# Patient Record
Sex: Male | Born: 1945 | Race: White | Hispanic: No | Smoking: Current every day smoker
Health system: Southern US, Community
[De-identification: ages and names within clinical notes are randomized; demographics above are authoritative.]

## PROBLEM LIST (undated history)

## (undated) DIAGNOSIS — Z7189 Other specified counseling: Secondary | ICD-10-CM

## (undated) DIAGNOSIS — E119 Type 2 diabetes mellitus without complications: Secondary | ICD-10-CM

## (undated) DIAGNOSIS — I2510 Atherosclerotic heart disease of native coronary artery without angina pectoris: Secondary | ICD-10-CM

## (undated) DIAGNOSIS — C787 Secondary malignant neoplasm of liver and intrahepatic bile duct: Secondary | ICD-10-CM

## (undated) DIAGNOSIS — I219 Acute myocardial infarction, unspecified: Secondary | ICD-10-CM

## (undated) DIAGNOSIS — C189 Malignant neoplasm of colon, unspecified: Secondary | ICD-10-CM

## (undated) DIAGNOSIS — C180 Malignant neoplasm of cecum: Secondary | ICD-10-CM

## (undated) DIAGNOSIS — I10 Essential (primary) hypertension: Secondary | ICD-10-CM

## (undated) HISTORY — PX: CORONARY ANGIOPLASTY WITH STENT PLACEMENT: SHX49

## (undated) HISTORY — PX: COLON SURGERY: SHX602

## (undated) HISTORY — DX: Malignant neoplasm of cecum: C18.0

## (undated) HISTORY — DX: Secondary malignant neoplasm of liver and intrahepatic bile duct: C78.7

## (undated) HISTORY — DX: Malignant neoplasm of colon, unspecified: C18.9

## (undated) HISTORY — DX: Other specified counseling: Z71.89

---

## 2017-03-22 ENCOUNTER — Emergency Department (HOSPITAL_BASED_OUTPATIENT_CLINIC_OR_DEPARTMENT_OTHER)
Admission: EM | Admit: 2017-03-22 | Discharge: 2017-03-22 | Disposition: A | Discharge status: Home / Self Care | Payer: Medicare HMO | Attending: Emergency Medicine | Admitting: Emergency Medicine

## 2017-03-22 ENCOUNTER — Emergency Department (HOSPITAL_BASED_OUTPATIENT_CLINIC_OR_DEPARTMENT_OTHER): Payer: Medicare HMO

## 2017-03-22 ENCOUNTER — Encounter (HOSPITAL_BASED_OUTPATIENT_CLINIC_OR_DEPARTMENT_OTHER): Payer: Self-pay | Admitting: *Deleted

## 2017-03-22 ENCOUNTER — Other Ambulatory Visit: Payer: Self-pay

## 2017-03-22 DIAGNOSIS — I2510 Atherosclerotic heart disease of native coronary artery without angina pectoris: Secondary | ICD-10-CM | POA: Diagnosis not present

## 2017-03-22 DIAGNOSIS — E119 Type 2 diabetes mellitus without complications: Secondary | ICD-10-CM | POA: Diagnosis not present

## 2017-03-22 DIAGNOSIS — Z7984 Long term (current) use of oral hypoglycemic drugs: Secondary | ICD-10-CM | POA: Diagnosis not present

## 2017-03-22 DIAGNOSIS — Z79899 Other long term (current) drug therapy: Secondary | ICD-10-CM | POA: Diagnosis not present

## 2017-03-22 DIAGNOSIS — F17210 Nicotine dependence, cigarettes, uncomplicated: Secondary | ICD-10-CM | POA: Diagnosis not present

## 2017-03-22 DIAGNOSIS — C189 Malignant neoplasm of colon, unspecified: Secondary | ICD-10-CM | POA: Insufficient documentation

## 2017-03-22 DIAGNOSIS — C772 Secondary and unspecified malignant neoplasm of intra-abdominal lymph nodes: Secondary | ICD-10-CM | POA: Diagnosis not present

## 2017-03-22 DIAGNOSIS — I10 Essential (primary) hypertension: Secondary | ICD-10-CM | POA: Diagnosis not present

## 2017-03-22 DIAGNOSIS — Z955 Presence of coronary angioplasty implant and graft: Secondary | ICD-10-CM | POA: Diagnosis not present

## 2017-03-22 DIAGNOSIS — R1084 Generalized abdominal pain: Secondary | ICD-10-CM | POA: Diagnosis present

## 2017-03-22 HISTORY — DX: Acute myocardial infarction, unspecified: I21.9

## 2017-03-22 HISTORY — DX: Atherosclerotic heart disease of native coronary artery without angina pectoris: I25.10

## 2017-03-22 HISTORY — DX: Type 2 diabetes mellitus without complications: E11.9

## 2017-03-22 HISTORY — DX: Essential (primary) hypertension: I10

## 2017-03-22 HISTORY — DX: Malignant neoplasm of colon, unspecified: C18.9

## 2017-03-22 LAB — CBC WITH DIFFERENTIAL/PLATELET
Basophils Absolute: 0 10*3/uL (ref 0.0–0.1)
Basophils Relative: 0 %
Eosinophils Absolute: 0.3 10*3/uL (ref 0.0–0.7)
Eosinophils Relative: 3 %
HCT: 43.6 % (ref 39.0–52.0)
Hemoglobin: 14.9 g/dL (ref 13.0–17.0)
Lymphocytes Relative: 18 %
Lymphs Abs: 1.7 10*3/uL (ref 0.7–4.0)
MCH: 31.4 pg (ref 26.0–34.0)
MCHC: 34.2 g/dL (ref 30.0–36.0)
MCV: 92 fL (ref 78.0–100.0)
Monocytes Absolute: 0.8 10*3/uL (ref 0.1–1.0)
Monocytes Relative: 9 %
Neutro Abs: 6.4 10*3/uL (ref 1.7–7.7)
Neutrophils Relative %: 70 %
Platelets: 220 10*3/uL (ref 150–400)
RBC: 4.74 MIL/uL (ref 4.22–5.81)
RDW: 14.4 % (ref 11.5–15.5)
WBC: 9.1 10*3/uL (ref 4.0–10.5)

## 2017-03-22 LAB — COMPREHENSIVE METABOLIC PANEL
ALT: 27 U/L (ref 17–63)
AST: 23 U/L (ref 15–41)
Albumin: 4.4 g/dL (ref 3.5–5.0)
Alkaline Phosphatase: 95 U/L (ref 38–126)
Anion gap: 7 (ref 5–15)
BUN: 14 mg/dL (ref 6–20)
CO2: 27 mmol/L (ref 22–32)
Calcium: 9.7 mg/dL (ref 8.9–10.3)
Chloride: 103 mmol/L (ref 101–111)
Creatinine, Ser: 1.02 mg/dL (ref 0.61–1.24)
GFR calc Af Amer: >60 mL/min (ref >60)
GFR calc non Af Amer: >60 mL/min (ref >60)
Glucose, Bld: 134 mg/dL — ABNORMAL HIGH (ref 65–99)
Potassium: 3.5 mmol/L (ref 3.5–5.1)
Sodium: 137 mmol/L (ref 135–145)
Total Bilirubin: 0.6 mg/dL (ref 0.3–1.2)
Total Protein: 7.9 g/dL (ref 6.5–8.1)

## 2017-03-22 LAB — I-STAT CG4 LACTIC ACID, ED: Lactic Acid, Venous: 1.13 mmol/L (ref 0.5–1.9)

## 2017-03-22 LAB — URINALYSIS, ROUTINE W REFLEX MICROSCOPIC
Bilirubin Urine: NEGATIVE
Glucose, UA: NEGATIVE mg/dL
Hgb urine dipstick: NEGATIVE
Ketones, ur: NEGATIVE mg/dL
Leukocytes, UA: NEGATIVE
Nitrite: NEGATIVE
Protein, ur: NEGATIVE mg/dL
Specific Gravity, Urine: 1.01 (ref 1.005–1.030)
pH: 6 (ref 5.0–8.0)

## 2017-03-22 LAB — LIPASE, BLOOD: Lipase: 31 U/L (ref 11–51)

## 2017-03-22 MED ORDER — SODIUM CHLORIDE 0.9 % IV BOLUS (SEPSIS)
1000.0000 mL | Freq: Once | INTRAVENOUS | Status: AC
  Administered 2017-03-22: 1000 mL via INTRAVENOUS

## 2017-03-22 MED ORDER — IOPAMIDOL (ISOVUE-300) INJECTION 61%
100.0000 mL | Freq: Once | INTRAVENOUS | Status: AC | PRN
  Administered 2017-03-22: 100 mL via INTRAVENOUS

## 2017-03-22 NOTE — ED Notes (Signed)
Unable to void at this time. Transported to CT

## 2017-03-22 NOTE — ED Triage Notes (Addendum)
Patient states he was diagnosed with stage 4 colon cancer in feb 2018.  States he elected to have a colon resection and no chemo treatments.  Patient is currently using OTC meds, Graviola, grape seed abstract, flax seed oil curcumin, colloidal silver, carnigora.  States for the last one month, he has had diarrhea, which is watery and foul smelling, up to five times per day.  States he also has numbness in there lower right abdomen and groin and enlarged right testicle.  Pain is across the entire lower abdomen.  Patient reports a weight loss of 15-20 pounds as well.

## 2017-03-22 NOTE — ED Notes (Signed)
CT will need to wait for bun/creat to result prior to imaging with IV contrast per [facility] radiology protocol. Pt >69 years of age and hx DM.

## 2017-03-22 NOTE — ED Provider Notes (Signed)
[place] EMERGENCY DEPARTMENT Provider Note   CSN: [id number] Arrival date & time: 03/22/17  3532     History   Chief Complaint Chief Complaint  Patient presents with  . Abdominal Pain    HPI Peter Mccoy is a 71 y.o. male.  HPI Patient presents to the emergency department with abdominal discomfort along with diarrhea over the last month the patient states he had colon cancer resection done the beginning of this year.  Patient is a 20 pound weight loss over the last month.  Patient states that he did not seek any treatment after his colon cancer resection.  States he did mention that there were some lymph node spread at that time.  The patient denies chest pain, shortness of breath, headache,blurred vision, neck pain, fever, cough, weakness, numbness, dizziness, anorexia, edema, nausea, vomiting, rash, back pain, dysuria, hematemesis, bloody stool, near syncope, or syncope. Past Medical History:  Diagnosis Date  . Colon cancer ([place])    Stage 4 Colon Cancer  . Coronary artery disease    4 stents  . Diabetes mellitus without complication ([place])   . Hypertension   . MI (myocardial infarction) ([place])     There are no active problems to display for this patient.   Past Surgical History:  Procedure Laterality Date  . COLON SURGERY     Colon resection  . CORONARY ANGIOPLASTY WITH STENT PLACEMENT         Home Medications    Prior to Admission medications   Medication Sig Start Date End Date Taking? Authorizing Provider  amLODipine (NORVASC) 2.5 MG tablet Take 2.5 mg daily by mouth.   Yes [provider]  carvedilol (COREG) 12.5 MG tablet Take 12.5 mg 2 (two) times daily with a meal by mouth.   Yes [provider]  Flaxseed, Linseed, (GNP FLAX SEED OIL PO) Take by mouth.   Yes [provider]  GRAPE SEED EXTRACT PO Take by mouth.   Yes [provider]  isosorbide dinitrate (ISORDIL) 30 MG tablet Take 30 mg 4 (four) times  daily by mouth.   Yes [provider]  metFORMIN (GLUCOPHAGE) 500 MG tablet Take 1,000 mg 2 (two) times daily with a meal by mouth.   Yes [provider]  OVER THE COUNTER MEDICATION    Yes [provider]  [place]    Yes [provider]  [place]    Yes [provider]    Family History No family history on file.  Social History Social History   Tobacco Use  . Smoking status: Current Every Day Smoker    Packs/day: 1.00    Years: 50.00    Pack years: 50.00    Types: Cigarettes  . Smokeless tobacco: Never Used  Substance Use Topics  . Alcohol use: No    Frequency: Never  . Drug use: No     Allergies   Codeine   Review of Systems Review of Systems  All other systems negative except as documented in the HPI. All pertinent positives and negatives as reviewed in the HPI. Physical Exam Updated Vital Signs BP (!) 194/101 (BP Location: Right Arm)   Pulse (!) 56   Temp 97.8 F (36.6 C) (Oral)   Resp 20   Ht 6\' 5"  (1.956 m)   Wt 99.8 kg (220 lb)   SpO2 99%   BMI 26.09 kg/m   Physical Exam  Constitutional: He is oriented to person, place, and  time. He appears well-developed and well-nourished. No distress.  HENT:  Head: Normocephalic and atraumatic.  Mouth/Throat: Oropharynx is clear and moist.  Eyes: Pupils are equal, round, and reactive to light.  Neck: Normal range of motion. Neck supple.  Cardiovascular: Normal rate, regular rhythm and normal heart sounds. Exam reveals no gallop and no friction rub.  No murmur heard. Pulmonary/Chest: Effort normal and breath sounds normal. No respiratory distress. He has no wheezes.  Abdominal: Soft. Bowel sounds are normal. He exhibits no distension. There is generalized tenderness. There is no rigidity and no guarding.  Genitourinary: Right testis shows swelling. Right testis shows no tenderness.  Neurological: He is alert and oriented to person,  place, and time. He exhibits normal muscle tone. Coordination normal.  Skin: Skin is warm and dry. Capillary refill takes less than 2 seconds. No rash noted. No erythema.  Psychiatric: He has a normal mood and affect. His behavior is normal.  Nursing note and vitals reviewed.    ED Treatments / Results  Labs (all labs ordered are listed, but only abnormal results are displayed) Labs Reviewed  COMPREHENSIVE METABOLIC PANEL - Abnormal; Notable for the following components:      Result Value   Glucose, Bld 134 (*)    All other components within normal limits  LIPASE, BLOOD  CBC WITH DIFFERENTIAL/PLATELET  URINALYSIS, ROUTINE W REFLEX MICROSCOPIC  I-STAT CG4 LACTIC ACID, ED    EKG  EKG Interpretation None       Radiology US Scrotum  Result Date: 03/22/2017 CLINICAL DATA:  Right testicular pain and swelling for the past 2-3 days. EXAM: SCROTAL ULTRASOUND DOPPLER ULTRASOUND OF THE TESTICLES TECHNIQUE: Complete ultrasound examination of the testicles, epididymis, and other scrotal structures was performed. Color and spectral Doppler ultrasound were also utilized to evaluate blood flow to the testicles. COMPARISON:  None. FINDINGS: Right testicle Measurements: 4.1 x 2.9 x 3.3 cm. No mass or microlithiasis visualized. Left testicle Measurements: 3.9 x 2.5 x 2.8 cm. No mass or microlithiasis visualized. Right epididymis:  Normal in size and appearance. Left epididymis:  Normal in size and appearance. Hydrocele:  Moderate right and small left hydroceles. Varicocele:  None visualized. Pulsed Doppler interrogation of both testes demonstrates normal low resistance arterial and venous waveforms bilaterally. IMPRESSION: 1. Moderate right and small left hydroceles. Otherwise normal sonographic appearance of the bilateral testes. Electronically Signed   By: Titus Dubin M.D.   On: 03/22/2017 16:20   Ct Abdomen Pelvis W Contrast  Result Date: 03/22/2017 CLINICAL DATA:  Right colectomy for  perforated cecal mass. Stage IV colon cancer. Status post surgery in February of 2018. EXAM: CT ABDOMEN AND PELVIS WITH CONTRAST TECHNIQUE: Multidetector CT imaging of the abdomen and pelvis was performed using the standard protocol following bolus administration of intravenous contrast. CONTRAST:  150mL ISOVUE-300 IOPAMIDOL (ISOVUE-300) INJECTION 61% COMPARISON:  According to epic, this patient had a study on 06/27/2016 on those images cannot be located at this time. FINDINGS: Lower chest:  Unremarkable. Hepatobiliary: 2.3 cm subtle hypoattenuating lesion in the dome of the liver, concerning for metastatic disease. Differential attenuation in the liver parenchyma may be from differential perfusion or geographic fatty deposition. There is no evidence for gallstones, gallbladder wall thickening, or pericholecystic fluid. No intrahepatic or extrahepatic biliary dilation. Pancreas: No focal mass lesion. No dilatation of the main duct. No intraparenchymal cyst. No peripancreatic edema. Spleen: No splenomegaly. No focal mass lesion. Adrenals/Urinary Tract: Thickening of the adrenal glands noted without discrete nodule or mass. Tiny hypoattenuating lesions  in the right kidney are likely cysts. No evidence for hydroureter. The urinary bladder appears normal for the degree of distention. Stomach/Bowel: Stomach is nondistended. No gastric wall thickening. No evidence of outlet obstruction. Duodenum is normally positioned as is the ligament of Treitz. No small bowel obstruction. Status post right hemicolectomy with ileocolic anastomosis in the right upper quadrant. Probable diverticular change sigmoid colon. Vascular/Lymphatic: There is abdominal aortic atherosclerosis with 3.3 cm diameter infrarenal aorta. 18 mm short axis lymph node identified in the hepatoduodenal ligament. Other borderline hepatoduodenal ligament lymph nodes are associated. No retroperitoneal lymphadenopathy. No pelvic sidewall lymphadenopathy. There is  prominent nodularity in the mesentery with nodular soft tissue identified in the omentum and in the right upper abdomen. Upper normal lymph nodes are seen in the central small bowel mesenteric. There is peritoneal nodularity in the right para colic gutter with a trace amount of associated free fluid. 2.1 x 2.2 cm lesion in the central pelvis, adjacent to the rectosigmoid junction is concerning for metastatic disease. Reproductive: The prostate gland and seminal vesicles have normal imaging features. Other: Small volume intraperitoneal free fluid. Musculoskeletal: 17 mm soft tissue nodule identified in the midline anterior abdominal wall just cranial to the umbilicus. Just deep to the umbilicus is a 2.9 x 2.4 cm rim enhancing soft tissue nodule. 1 bilateral groin hernias contain only fat. Bone windows reveal no worrisome lytic or sclerotic osseous lesions. IMPRESSION: 1. Status post right hemicolectomy for colon cancer. No evidence for bowel obstruction. 2. Metastatic disease identified in the dome of the liver with diffuse nodularity of the mesentery, omentum, and peritoneal lining consistent with metastatic disease. There is borderline to mild lymphadenopathy in the hepatoduodenal ligament and central mesentery. At least 2 discrete soft tissue nodules are identified in the midline anterior abdominal wall concerning for metastatic disease and a 2.1 cm lesion in the central pelvis is also concerning for metastatic deposit. Electronically Signed   By: Misty Stanley M.D.   On: 03/22/2017 12:31   US Pelvic Doppler (torsion R/o Or Mass Arterial Flow)  Result Date: 03/22/2017 CLINICAL DATA:  Right testicular pain and swelling for the past 2-3 days. EXAM: SCROTAL ULTRASOUND DOPPLER ULTRASOUND OF THE TESTICLES TECHNIQUE: Complete ultrasound examination of the testicles, epididymis, and other scrotal structures was performed. Color and spectral Doppler ultrasound were also utilized to evaluate blood flow to the  testicles. COMPARISON:  None. FINDINGS: Right testicle Measurements: 4.1 x 2.9 x 3.3 cm. No mass or microlithiasis visualized. Left testicle Measurements: 3.9 x 2.5 x 2.8 cm. No mass or microlithiasis visualized. Right epididymis:  Normal in size and appearance. Left epididymis:  Normal in size and appearance. Hydrocele:  Moderate right and small left hydroceles. Varicocele:  None visualized. Pulsed Doppler interrogation of both testes demonstrates normal low resistance arterial and venous waveforms bilaterally. IMPRESSION: 1. Moderate right and small left hydroceles. Otherwise normal sonographic appearance of the bilateral testes. Electronically Signed   By: Titus Dubin M.D.   On: 03/22/2017 16:20    Procedures Procedures (including critical care time)  Medications Ordered in ED Medications  sodium chloride 0.9 % bolus 1,000 mL (0 mLs Intravenous Stopped 03/22/17 1133)  iopamidol (ISOVUE-300) 61 % injection 100 mL (100 mLs Intravenous Contrast Given 03/22/17 1142)     Initial Impression / Assessment and Plan / ED Course  I have reviewed the triage vital signs and the nursing notes.  Pertinent labs & imaging results that were available during my care of the patient were reviewed by me  and considered in my medical decision making (see chart for details).    Advised the patient that there is metastatic disease noted on the CT scan that he would need to follow-up with oncology to formulate a plan otherwise he could still seek no treatment.  Advised patient return here as needed patient agrees to the plan and all questions were answered.  I did advise him to follow-up with his primary care doctor.  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

## 2017-03-22 NOTE — Discharge Instructions (Signed)
Follow-up with the specialist provided.  Your ultrasound did not show any significant abnormalities in the scrotum.  There was some mild fluid collection in the testicle. return here as needed.  follow-up with your primary care doctor.

## 2017-03-23 ENCOUNTER — Other Ambulatory Visit: Payer: Self-pay | Admitting: Oncology

## 2017-04-02 ENCOUNTER — Encounter: Payer: Self-pay | Admitting: Family

## 2017-04-02 ENCOUNTER — Other Ambulatory Visit: Payer: Self-pay

## 2017-04-02 ENCOUNTER — Ambulatory Visit (HOSPITAL_BASED_OUTPATIENT_CLINIC_OR_DEPARTMENT_OTHER): Payer: Medicare HMO | Admitting: Family

## 2017-04-02 ENCOUNTER — Other Ambulatory Visit (HOSPITAL_BASED_OUTPATIENT_CLINIC_OR_DEPARTMENT_OTHER): Payer: Medicare HMO

## 2017-04-02 ENCOUNTER — Other Ambulatory Visit: Payer: Self-pay | Admitting: Family

## 2017-04-02 VITALS — BP 98/62 | HR 60 | Temp 97.6°F | Resp 18 | Wt 220.0 lb

## 2017-04-02 DIAGNOSIS — C787 Secondary malignant neoplasm of liver and intrahepatic bile duct: Principal | ICD-10-CM

## 2017-04-02 DIAGNOSIS — C786 Secondary malignant neoplasm of retroperitoneum and peritoneum: Secondary | ICD-10-CM | POA: Diagnosis not present

## 2017-04-02 DIAGNOSIS — C779 Secondary and unspecified malignant neoplasm of lymph node, unspecified: Secondary | ICD-10-CM | POA: Diagnosis not present

## 2017-04-02 DIAGNOSIS — C189 Malignant neoplasm of colon, unspecified: Secondary | ICD-10-CM

## 2017-04-02 DIAGNOSIS — R197 Diarrhea, unspecified: Secondary | ICD-10-CM

## 2017-04-02 DIAGNOSIS — C180 Malignant neoplasm of cecum: Secondary | ICD-10-CM

## 2017-04-02 DIAGNOSIS — Z7189 Other specified counseling: Secondary | ICD-10-CM

## 2017-04-02 DIAGNOSIS — Z720 Tobacco use: Secondary | ICD-10-CM | POA: Diagnosis not present

## 2017-04-02 DIAGNOSIS — E119 Type 2 diabetes mellitus without complications: Secondary | ICD-10-CM | POA: Diagnosis not present

## 2017-04-02 DIAGNOSIS — R229 Localized swelling, mass and lump, unspecified: Secondary | ICD-10-CM

## 2017-04-02 LAB — CMP (CANCER CENTER ONLY)
ALBUMIN: 3.8 g/dL (ref 3.3–5.5)
ALT: 24 U/L (ref 10–47)
AST: 18 U/L (ref 11–38)
Alkaline Phosphatase: 96 U/L — ABNORMAL HIGH (ref 26–84)
BILIRUBIN TOTAL: 0.9 mg/dL (ref 0.20–1.60)
BUN, Bld: 19 mg/dL (ref 7–22)
CO2: 28 meq/L (ref 18–33)
CREATININE: 1.5 mg/dL — AB (ref 0.6–1.2)
Calcium: 9.3 mg/dL (ref 8.0–10.3)
Chloride: 100 mEq/L (ref 98–108)
GLUCOSE: 138 mg/dL — AB (ref 73–118)
Potassium: 3.7 mEq/L (ref 3.3–4.7)
SODIUM: 142 meq/L (ref 128–145)
Total Protein: 7.2 g/dL (ref 6.4–8.1)

## 2017-04-02 LAB — CBC WITH DIFFERENTIAL (CANCER CENTER ONLY)
BASO#: 0 10*3/uL (ref 0.0–0.2)
BASO%: 0.3 % (ref 0.0–2.0)
EOS%: 2.8 % (ref 0.0–7.0)
Eosinophils Absolute: 0.3 10*3/uL (ref 0.0–0.5)
HCT: 46 % (ref 38.7–49.9)
HEMOGLOBIN: 15.7 g/dL (ref 13.0–17.1)
LYMPH#: 2.1 10*3/uL (ref 0.9–3.3)
LYMPH%: 19.7 % (ref 14.0–48.0)
MCH: 31.8 pg (ref 28.0–33.4)
MCHC: 34.1 g/dL (ref 32.0–35.9)
MCV: 93 fL (ref 82–98)
MONO#: 0.9 10*3/uL (ref 0.1–0.9)
MONO%: 8.2 % (ref 0.0–13.0)
NEUT%: 69 % (ref 40.0–80.0)
NEUTROS ABS: 7.3 10*3/uL — AB (ref 1.5–6.5)
Platelets: 173 10*3/uL (ref 145–400)
RBC: 4.94 10*6/uL (ref 4.20–5.70)
RDW: 14.7 % (ref 11.1–15.7)
WBC: 10.5 10*3/uL — AB (ref 4.0–10.0)

## 2017-04-02 NOTE — Progress Notes (Addendum)
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 He was diagnosed with stage V colon cancer with partial colectomy with anastomosis in February 2018. 4/15 lymph node were positive and there was presence of a peritoneal implant. CEA at that time was 30.7.  He decided after speaking with his oncologist at [facility] that he did not want chemotherapy at that time.  He recently went to the ED with abdominal pain and CT scan of the abdomen and pelvis confirmed progression of disease into the liver, mesentery, omentum and peritoneal lining as well as 2 discrete soft tissue nodules in the midline anterior abdominal wall.  He is symptomatic with fatigue, diarrhea and abdominal pain at his time.  CEA today is 56.63. His CBC and CMP today are stable. No anemia, WBC count is 10.5.  No family history of cancer that he is aware of.  He had not eaten today and got dizzy after having his labs drawn. He felt better after having a coke and some crackers.  No fever, chills, n/v, cough, rash, SOB, chest pain, palpitations, abdominal pain or changes in bladder habits.  No swelling tingling in his extremities. He states that he is numb in his right thigh.  He has maintained a good appetite but has not wanted to eat for fear of having diarrhea. He is doing his best to hydrated. He states that his weight has dropped 20 lbs over the last month.  He is diabetic but states that his blood sugars are well controlled staying in the 120-130's.  He is a smoker, < 1 ppd. No ETOH.  He lives with his ex wife.   ROS: All other 10 point review of systems is negative.   PAST MEDICAL  HISTORY:   Past Medical History:  Diagnosis Date  . Colon cancer ([place])    Stage 4 Colon Cancer  . Coronary artery disease    4 stents  . Diabetes mellitus without complication ([place])   . Hypertension   . MI (myocardial infarction) ([place])     ALLERGIES: Allergies  Allergen Reactions  . Codeine Rash and Anxiety      MEDICATIONS:  Current Outpatient Medications on File Prior to Visit  Medication Sig Dispense Refill  . TURMERIC CURCUMIN PO Take by mouth.    Marland Kitchen amLODipine (NORVASC) 2.5 MG tablet Take 2.5 mg daily by mouth.    . carvedilol (COREG) 12.5 MG tablet Take 12.5 mg 2 (two) times daily with a meal by mouth.    Marland Kitchen GRAPE SEED EXTRACT PO Take by mouth.    . isosorbide dinitrate (ISORDIL) 30 MG tablet Take 30 mg by mouth daily.     Marland Kitchen lisinopril (PRINIVIL,ZESTRIL) 5 MG tablet     . metFORMIN (GLUCOPHAGE) 500 MG tablet Take 1,000 mg 2 (two) times daily with a meal by mouth.    Marland Kitchen OVER THE COUNTER MEDICATION     . OVER THE COUNTER MEDICATION     . OVER THE COUNTER MEDICATION      No current facility-administered medications on file prior to visit.      PAST SURGICAL HISTORY Past Surgical History:  Procedure Laterality Date  . COLON SURGERY     Colon resection  . CORONARY ANGIOPLASTY WITH STENT PLACEMENT      FAMILY HISTORY: History reviewed. No pertinent family history.  SOCIAL HISTORY:  reports that he has been smoking cigarettes.  He has a 50.00 pack-year smoking history. he has never used smokeless tobacco. He reports that he does not drink alcohol or use drugs.  PERFORMANCE STATUS: The patient's performance status is 1 - Symptomatic but completely ambulatory  PHYSICAL EXAM: Most Recent Vital Signs: Blood pressure 98/62, pulse 60, temperature 97.6 F (36.4 C), temperature source Oral, resp. rate 18, weight 220 lb (99.8 kg), SpO2 100 %. BP 98/62 (BP Location: Right Arm, Patient Position: Sitting)   Pulse 60   Temp 97.6 F (36.4 C) (Oral)   Resp 18   Wt 220 lb  (99.8 kg)   SpO2 100%   BMI 26.09 kg/m   General Appearance:    Alert, cooperative, no distress, appears stated age  Head:    Normocephalic, without obvious abnormality, atraumatic  Eyes:    PERRL, conjunctiva/corneas clear, EOM's intact, fundi    benign, both eyes             Throat:   Lips, mucosa, and tongue normal; teeth and gums normal  Neck:   Supple, symmetrical, trachea midline, no adenopathy;       thyroid:  No enlargement/tenderness/nodules; no carotid   bruit or JVD  Back:     Symmetric, no curvature, ROM normal, no CVA tenderness  Lungs:     Clear to auscultation bilaterally, respirations unlabored  Chest wall:    No tenderness or deformity  Heart:    Regular rate and rhythm, S1 and S2 normal, no murmur, rub   or gallop  Abdomen:     Soft, non-tender, bowel sounds active all four quadrants,    no masses, no organomegaly        Extremities:   Extremities normal, atraumatic, no cyanosis or edema  Pulses:   2+ and symmetric all extremities  Skin:   Skin color, texture, turgor normal, no rashes or lesions  Lymph nodes:   Cervical, supraclavicular, and axillary nodes normal  Neurologic:   CNII-XII intact. Normal strength, sensation and reflexes      throughout    LABORATORY DATA:  Results for orders placed or performed in visit on 04/02/17 (from the past 48 hour(s))  CBC w/Diff     Status: Abnormal   Collection Time: 04/02/17  3:12 PM  Result Value Ref Range   WBC 10.5 (H) 4.0 - 10.0 10e3/uL   RBC 4.94 4.20 - 5.70 10e6/uL   HGB 15.7 13.0 - 17.1 g/dL   HCT 46.0 38.7 - 49.9 %   MCV 93 82 - 98 fL   MCH 31.8 28.0 - 33.4 pg   MCHC 34.1 32.0 - 35.9 g/dL   RDW 14.7 11.1 - 15.7 %   Platelets 173 145 - 400 10e3/uL   NEUT# 7.3 (H) 1.5 - 6.5 10e3/uL   LYMPH# 2.1 0.9 - 3.3 10e3/uL   MONO# 0.9 0.1 - 0.9 10e3/uL   Eosinophils Absolute 0.3 0.0 - 0.5 10e3/uL   BASO# 0.0 0.0 - 0.2 10e3/uL   NEUT% 69.0 40.0 - 80.0 %   LYMPH% 19.7 14.0 - 48.0 %   MONO% 8.2 0.0 - 13.0 %   EOS%  2.8 0.0 - 7.0 %   BASO% 0.3 0.0 - 2.0 %  CMP STAT     Status: Abnormal  Collection Time: 04/02/17  3:12 PM  Result Value Ref Range   Sodium 142 128 - 145 mEq/L   Potassium 3.7 3.3 - 4.7 mEq/L   Chloride 100 98 - 108 mEq/L   CO2 28 18 - 33 mEq/L   Glucose, Bld 138 (H) 73 - 118 mg/dL   BUN, Bld 19 7 - 22 mg/dL   Creat 1.5 (H) 0.6 - 1.2 mg/dl   Total Bilirubin 0.90 0.20 - 1.60 mg/dl   Alkaline Phosphatase 96 (H) 26 - 84 U/L   AST 18 11 - 38 U/L   ALT(SGPT) 24 10 - 47 U/L   Total Protein 7.2 6.4 - 8.1 g/dL   Albumin 3.8 3.3 - 5.5 g/dL   Calcium 9.3 8.0 - 10.3 mg/dL      RADIOGRAPHY: No results found.     PATHOLOGY: None   ASSESSMENT/PLAN: Ms. Oldaker is a very pleasant 71 yo caucasian gentleman with metastatic colon cancer. He had a partial colectomy in February after diagnosis and then decided he did not want further treatment at that time. He recently went to the ED with abdominal pain and CT confirmed further progression of disease.  He is symptomatic with diarrhea and will try imodium.  We will get a chest xray this week.  We discussed Xeloda and Oxaliplatin in detail and went over possible side effects. He verbalized understanding and wants to start treatment.  We will try to get all of his surgical pathology and cytology reports from [facility].  We will plan to start treatment within the next 2 weeks.   All questions were answered and he is in agreement with the plan. He will contact our office with any questions or concerns. We can certainly see him sooner if necessary.  He was discussed with and also seen by Dr. Marin Olp and he is in agreement with the aforementioned.   [facility]    ADDENDUM:  I saw and examined the patient with Sarah.  I agree with the above assessment.  It is apparent that he would benefit from systemic therapy intervention.  It seems like when he was seen elsewhere, he really was not given options for treatment.  He felt like he was "just in  number."  He really did not feel like he was being heard.  I think that we can treat him with out having a Port-A-Cath placed.  He is just very nervous about having a Port-A-Cath put in.  I think that we can treat him with Xeloda and oxaliplatin.  It sounds like he has a right-sided colon tumor.  We really need to see what his genetic markers are.  His tumor is MSI stable.  We really need to see what his K-ras study is.  I believe that with the use of Xeloda/oxaliplatin, we can at least get a response.  He knows that he is dealing with a tumor that is not curable.  He clearly has metastatic disease.  It is been 8 months since he has had surgery.  We talked him for about an hour.  We gave him information about Xeloda and oxaliplatin.  He would be very interested in trying treatment.  Given that this is a right sided tumor, I will see if we need to add Avastin.  We will see what his CEA level is.  I do not think we have to get a PET scan on him.  I think the scans that he has had already are adequate.  Our goal here  is to definitely improve his quality of life.  Again, we are not going to cure this.  I think a chest x-ray would also be helpful.  We answered all of his questions.  He is appreciative that we are trying to be helpful but more importantly that we are actually working with him to honor his request.  We will try to get everything set up to start in a couple weeks.  Lattie Haw, MD

## 2017-04-03 ENCOUNTER — Encounter: Payer: Self-pay | Admitting: Family

## 2017-04-03 DIAGNOSIS — C189 Malignant neoplasm of colon, unspecified: Secondary | ICD-10-CM

## 2017-04-03 DIAGNOSIS — C787 Secondary malignant neoplasm of liver and intrahepatic bile duct: Secondary | ICD-10-CM

## 2017-04-03 DIAGNOSIS — Z7189 Other specified counseling: Secondary | ICD-10-CM

## 2017-04-03 DIAGNOSIS — C180 Malignant neoplasm of cecum: Secondary | ICD-10-CM

## 2017-04-03 HISTORY — DX: Secondary malignant neoplasm of liver and intrahepatic bile duct: C78.7

## 2017-04-03 HISTORY — DX: Other specified counseling: Z71.89

## 2017-04-03 HISTORY — DX: Malignant neoplasm of cecum: C18.0

## 2017-04-03 HISTORY — DX: Malignant neoplasm of colon, unspecified: C18.9

## 2017-04-03 LAB — CEA (IN HOUSE-CHCC): CEA (CHCC-IN HOUSE): 56.63 ng/mL — AB (ref 0.00–5.00)

## 2017-04-03 LAB — LACTATE DEHYDROGENASE: LDH: 201 U/L (ref 125–245)

## 2017-04-03 NOTE — Progress Notes (Signed)
START ON PATHWAY REGIMEN - Colorectal     A cycle is every 21 days:     Capecitabine      Oxaliplatin      Bevacizumab   **Always confirm dose/schedule in your pharmacy ordering system**    Patient Characteristics: Metastatic Colorectal, First Line, Nonsurgical Candidate, KRAS Mutation Positive/Unknown, BRAF Wild-Type/Unknown, PS = 0,1; Bevacizumab Eligible Current evidence of distant metastases<= Yes AJCC T Category: T3 AJCC N Category: N2 AJCC M Category: M1c AJCC 8 Stage Grouping: IVC BRAF Mutation Status: Awaiting Test Results KRAS/NRAS Mutation Status: Awaiting Test Results Line of therapy: First Line Would you be surprised if this patient died  in the next year<= I would be surprised if this patient died in the next year Performance Status: PS = 0, 1 Bevacizumab Eligibility: Eligible Intent of Therapy: Non-Curative / Palliative Intent, Discussed with Patient

## 2017-04-09 ENCOUNTER — Other Ambulatory Visit: Payer: Medicare HMO

## 2017-04-14 ENCOUNTER — Ambulatory Visit: Payer: Medicare HMO | Admitting: Hematology & Oncology

## 2017-04-14 ENCOUNTER — Ambulatory Visit: Payer: Medicare HMO

## 2017-04-14 ENCOUNTER — Other Ambulatory Visit: Payer: Medicare HMO

## 2017-04-15 ENCOUNTER — Ambulatory Visit: Payer: Medicare HMO

## 2017-04-15 ENCOUNTER — Ambulatory Visit: Payer: Medicare HMO | Admitting: Hematology & Oncology

## 2017-04-15 ENCOUNTER — Other Ambulatory Visit: Payer: Medicare HMO

## 2017-05-06 ENCOUNTER — Ambulatory Visit: Payer: Medicare HMO

## 2017-05-06 ENCOUNTER — Ambulatory Visit: Payer: Medicare HMO | Admitting: Hematology & Oncology

## 2017-05-06 ENCOUNTER — Other Ambulatory Visit: Payer: Medicare HMO

## 2017-05-07 ENCOUNTER — Encounter: Payer: Self-pay | Admitting: Hematology & Oncology

## 2017-05-27 ENCOUNTER — Inpatient Hospital Stay: Payer: Medicare HMO

## 2017-05-27 ENCOUNTER — Inpatient Hospital Stay: Payer: Medicare HMO | Attending: Hematology & Oncology | Admitting: Hematology & Oncology

## 2017-06-05 DEATH — deceased

## 2017-06-17 ENCOUNTER — Ambulatory Visit: Payer: Medicare HMO

## 2017-06-17 ENCOUNTER — Ambulatory Visit: Payer: Medicare HMO | Admitting: Hematology & Oncology

## 2017-06-17 ENCOUNTER — Inpatient Hospital Stay: Payer: Self-pay

## 2018-11-01 IMAGING — CT CT ABD-PELV W/ CM
2 of 5 series · 15 of 46 positions shown, 17 images · IV contrast (APPLIED)
Comparison: According to [REDACTED], this patient had a study on
06/27/2016 on those images cannot be located at this time.

CLINICAL DATA: Right colectomy for perforated cecal mass. Stage IV
colon cancer. Status post surgery in Sunday June, 2016.

EXAM:
CT ABDOMEN AND PELVIS WITH CONTRAST
TECHNIQUE: Multidetector CT imaging of the abdomen and pelvis was performed
using the standard protocol following bolus administration of
intravenous contrast.
CONTRAST:  100mL [id number]-500 IOPAMIDOL ([id number]-500) INJECTION 61%

[Series 2: axial st · axial · 0.85mm/px · z∈[-482,-42]mm · 12 of 100 slices shown, 14 images]
[im 6/100  soft-tissue]
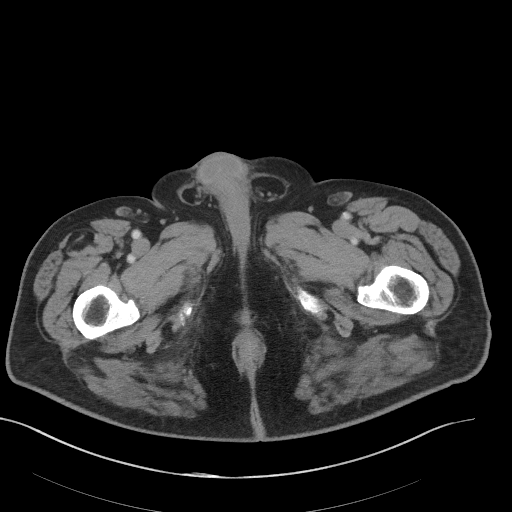
[im 6/100  bone]
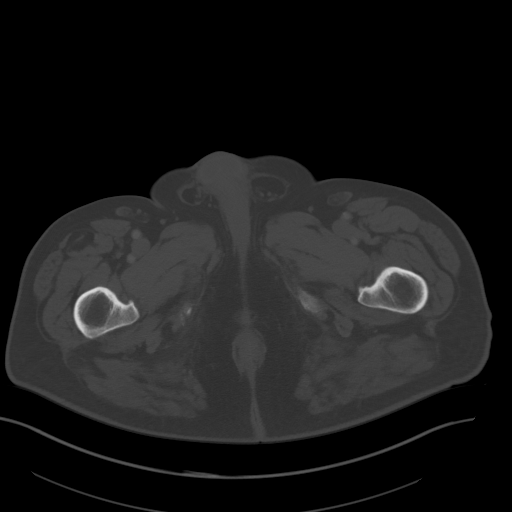
[im 16/100  soft-tissue]
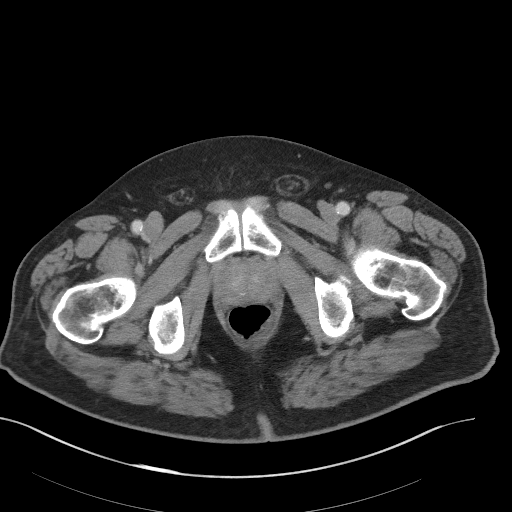
[im 21/100  soft-tissue]
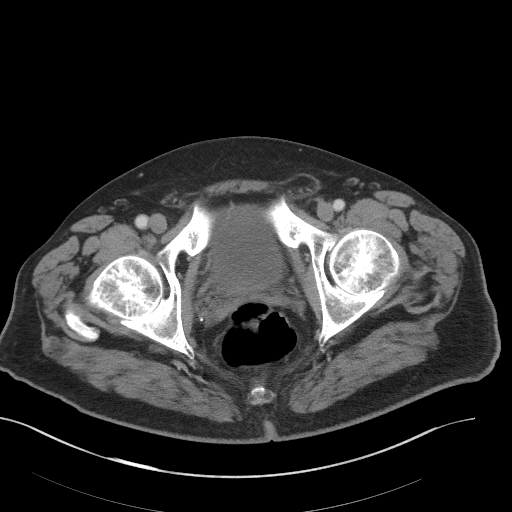
[im 32/100  soft-tissue]
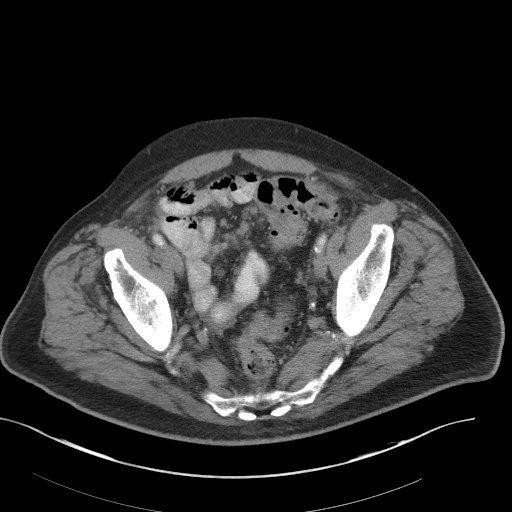
[im 37/100  soft-tissue]
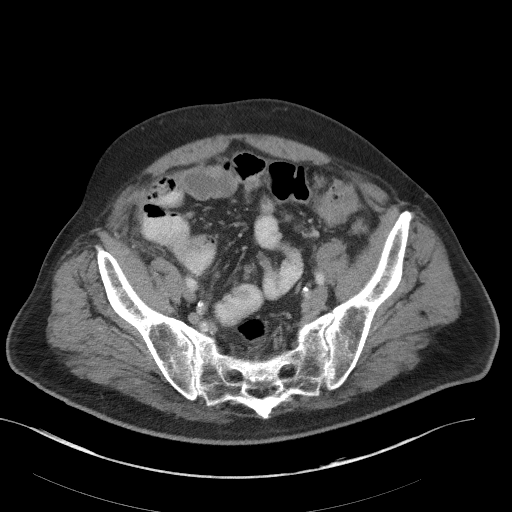
[im 47/100  soft-tissue]
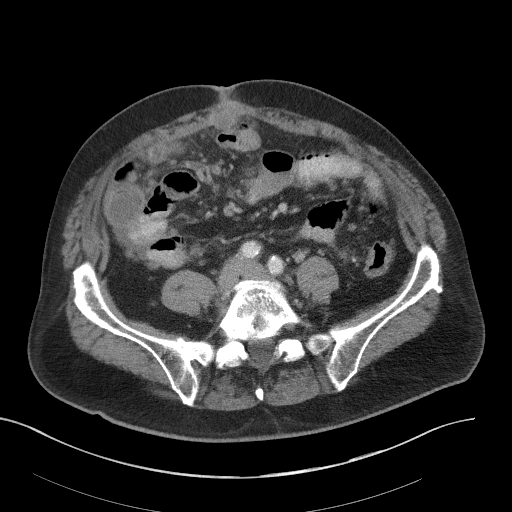
[im 53/100  soft-tissue]
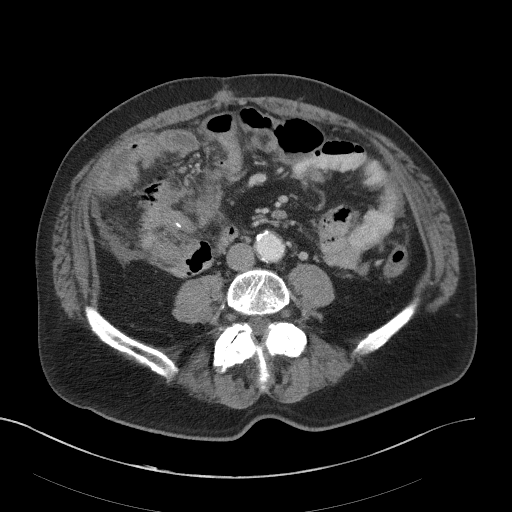
[im 63/100  soft-tissue]
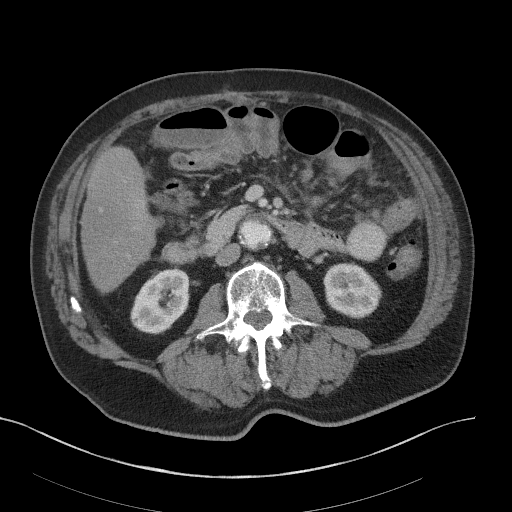
[im 68/100  soft-tissue]
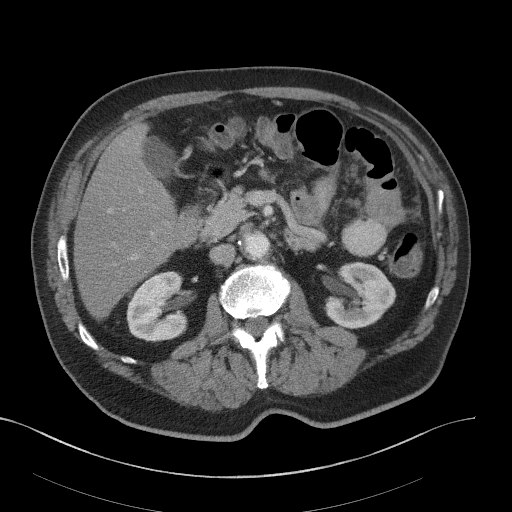
[im 68/100  bone]
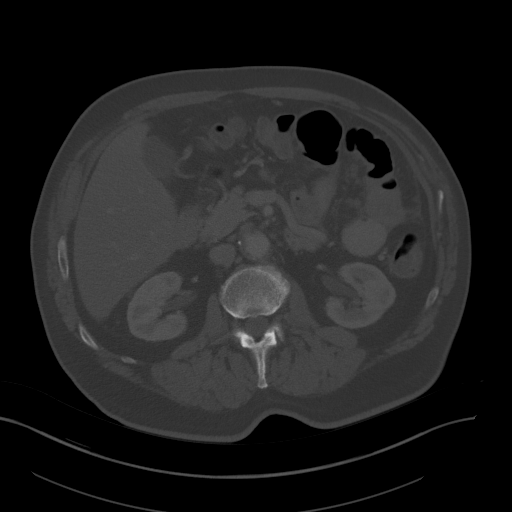
[im 79/100  soft-tissue]
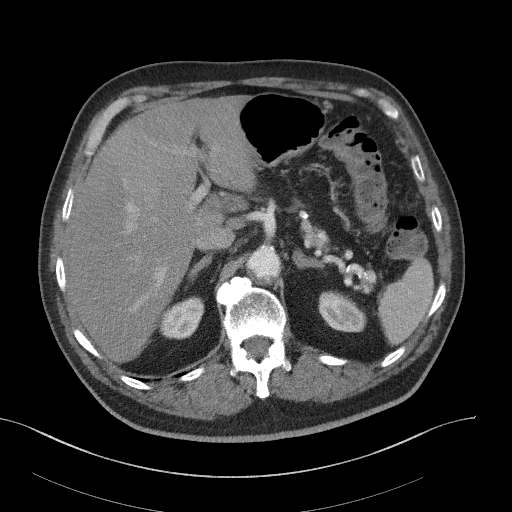
[im 84/100  soft-tissue]
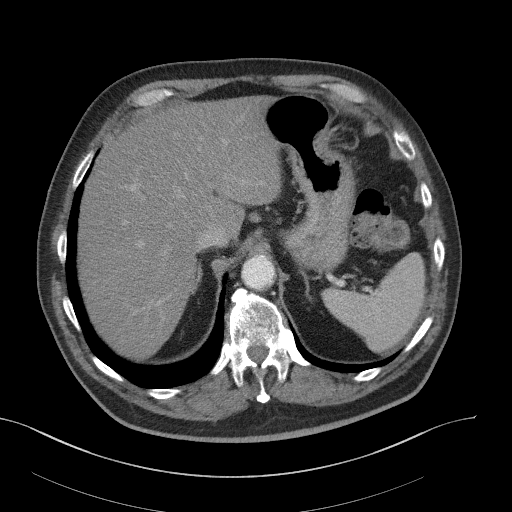
[im 94/100  soft-tissue]
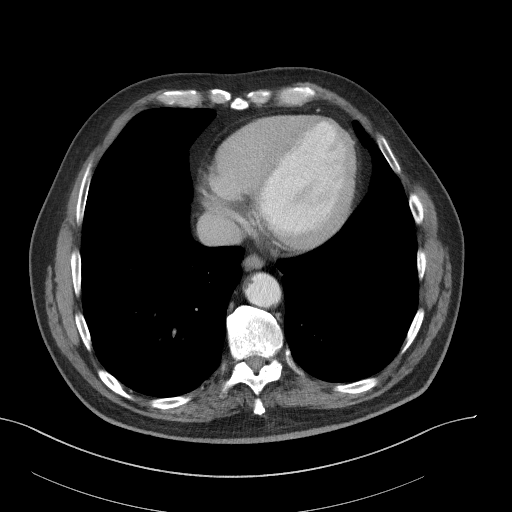

[Series 5: coronal st · coronal · 0.86mm/px · 3 of 101 slices shown]
[im 34/101  soft-tissue]
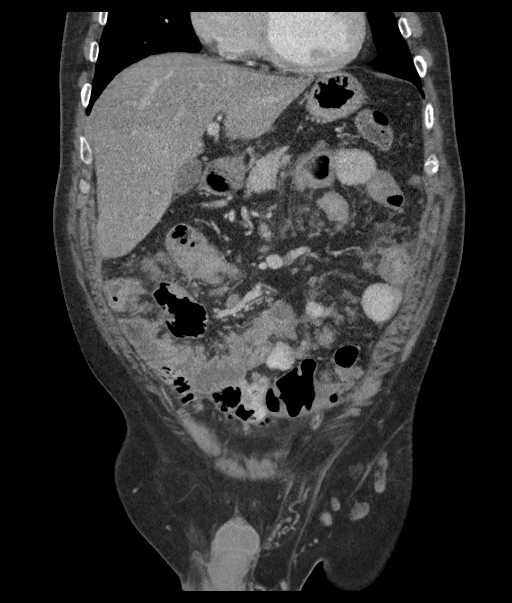
[im 45/101  soft-tissue]
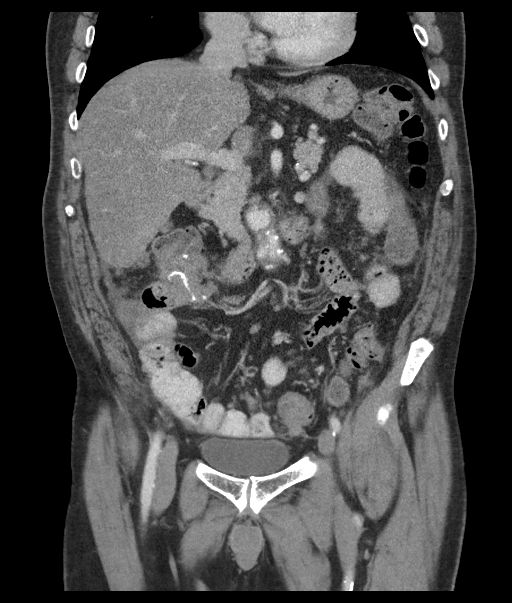
[im 56/101  soft-tissue]
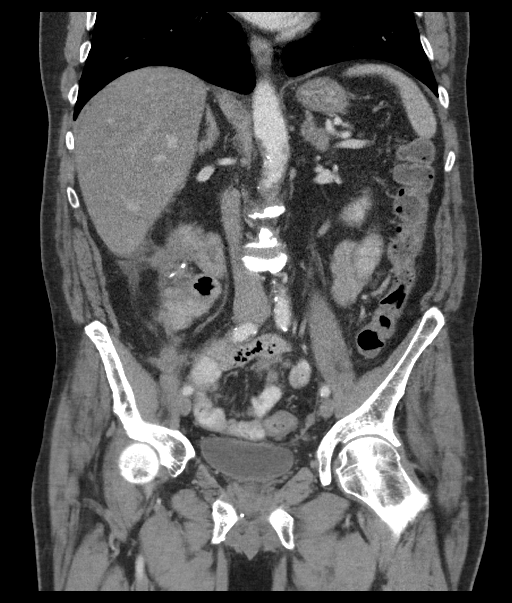

[15 of 46 positions shown; findings below may reference images not displayed]

FINDINGS: Lower chest:  Unremarkable.

Hepatobiliary: 2.3 cm subtle hypoattenuating lesion in the dome of
the liver, concerning for metastatic disease. Differential
attenuation in the liver parenchyma may be from differential
perfusion or geographic fatty deposition. There is no evidence for
gallstones, gallbladder wall thickening, or pericholecystic fluid.
No intrahepatic or extrahepatic biliary dilation.

Pancreas: No focal mass lesion. No dilatation of the main duct. No
intraparenchymal cyst. No peripancreatic edema.

Spleen: No splenomegaly. No focal mass lesion.

Adrenals/Urinary Tract: Thickening of the adrenal glands noted
without discrete nodule or mass. Tiny hypoattenuating lesions in the
right kidney are likely cysts. No evidence for hydroureter. The
urinary bladder appears normal for the degree of distention.

Stomach/Bowel: Stomach is nondistended. No gastric wall thickening.
No evidence of outlet obstruction. Duodenum is normally positioned
as is the ligament of Treitz. No small bowel obstruction. Status
post right hemicolectomy with ileocolic anastomosis in the right
upper quadrant. Probable diverticular change sigmoid colon.

Vascular/Lymphatic: There is abdominal aortic atherosclerosis with
3.3 cm diameter infrarenal aorta. 18 mm short axis lymph node
identified in the hepatoduodenal ligament. Other borderline
hepatoduodenal ligament lymph nodes are associated. No
retroperitoneal lymphadenopathy. No pelvic sidewall lymphadenopathy.
There is prominent nodularity in the mesentery with nodular soft
tissue identified in the omentum and in the right upper abdomen.
Upper normal lymph nodes are seen in the central small bowel
mesenteric. There is peritoneal nodularity in the right para colic
gutter with a trace amount of associated free fluid. 2.1 x 2.2 cm
lesion in the central pelvis, adjacent to the rectosigmoid junction
is concerning for metastatic disease.

Reproductive: The prostate gland and seminal vesicles have normal
imaging features.

Other: Small volume intraperitoneal free fluid.

Musculoskeletal: 17 mm soft tissue nodule identified in the midline
anterior abdominal wall just cranial to the umbilicus. Just deep to
the umbilicus is a 2.9 x 2.4 cm rim enhancing soft tissue nodule. 1
bilateral groin hernias contain only fat. Bone windows reveal no
worrisome lytic or sclerotic osseous lesions.
IMPRESSION: 1. Status post right hemicolectomy for colon cancer. No evidence for
bowel obstruction.
2. Metastatic disease identified in the dome of the liver with
diffuse nodularity of the mesentery, omentum, and peritoneal lining
consistent with metastatic disease. There is borderline to mild
lymphadenopathy in the hepatoduodenal ligament and central
mesentery. At least 2 discrete soft tissue nodules are identified in
the midline anterior abdominal wall concerning for metastatic
disease and a 2.1 cm lesion in the central pelvis is also concerning
for metastatic deposit.
